# Patient Record
Sex: Female | Born: 2005
Health system: Southern US, Community
[De-identification: ages and names within clinical notes are randomized; demographics above are authoritative.]

---
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
## 2024-01-30 ENCOUNTER — Other Ambulatory Visit (HOSPITAL_COMMUNITY)
Admission: RE | Admit: 2024-01-30 | Discharge: 2024-01-30 | Disposition: A | Discharge status: Home / Self Care | Source: Ambulatory Visit | Attending: Certified Nurse Midwife | Admitting: Certified Nurse Midwife

## 2024-01-30 ENCOUNTER — Encounter: Payer: Self-pay | Admitting: Certified Nurse Midwife

## 2024-01-30 ENCOUNTER — Ambulatory Visit (INDEPENDENT_AMBULATORY_CARE_PROVIDER_SITE_OTHER): Admitting: Certified Nurse Midwife

## 2024-01-30 VITALS — BP 124/78 | HR 89 | Wt 132.0 lb

## 2024-01-30 DIAGNOSIS — Z3009 Encounter for other general counseling and advice on contraception: Secondary | ICD-10-CM | POA: Diagnosis not present

## 2024-01-30 DIAGNOSIS — Z113 Encounter for screening for infections with a predominantly sexual mode of transmission: Secondary | ICD-10-CM | POA: Insufficient documentation

## 2024-01-30 DIAGNOSIS — Z3041 Encounter for surveillance of contraceptive pills: Secondary | ICD-10-CM

## 2024-01-30 NOTE — Progress Notes (Signed)
 New Teen GYN here to Establish Care.  (Mom here with pt waiting in lobby made aware of Teen time) *Mom would like to discuss if [facility] is ok or needs to change.   LMP: 01/22/24 light flow. Contraception: Pills  STD Screening: Desires. (Self Swab)    CC: HA's on right side of face behind eye will be very painful will have them before period. Will take Excedrin gives relief most times but if HA is too severe does not help at all.

## 2024-01-31 LAB — RPR: RPR Ser Ql: NONREACTIVE

## 2024-01-31 LAB — HIV ANTIBODY (ROUTINE TESTING W REFLEX): HIV Screen 4th Generation wRfx: NONREACTIVE

## 2024-01-31 NOTE — Progress Notes (Signed)
 GYNECOLOGY OFFICE VISIT NOTE  History:   Barbara Ayala is a 18 y.o. G0P0000 here today for birth control counseling. She is currently using a oral contraceptive pill, originally rx'd for painful menstrual periods. Patient reports significant improvement. She is going off to college in the fall and has recently became sexually active. She is concerned that her birth control will no longer work or work appropriately.  Reports a normal 21-25 day cycle with a 3-5 day bleed. Reports a headache around the time of her period that is managed well with Excedrin.  Patient request STD testing today.  . She denies any abnormal vaginal discharge, bleeding, pelvic pain or other concerns.     History reviewed. No pertinent past medical history.  History reviewed. No pertinent surgical history.  The following portions of the patient's history were reviewed and updated as appropriate: allergies, current medications, past family history, past medical history, past social history, past surgical history and problem list.   Health Maintenance:  Due to age, PAP and Mammogram not recommended at this time  Review of Systems:  Pertinent items noted in HPI and remainder of comprehensive ROS otherwise negative.  Physical Exam:  BP 124/78   Pulse 89   Wt 132 lb (59.9 kg)   LMP 01/22/2024  CONSTITUTIONAL: Well-developed, well-nourished female in no acute distress.  HEENT:  Normocephalic, atraumatic. External right and left ear normal. No scleral icterus.  NECK: Normal range of motion, supple, no masses noted on observation SKIN: No rash noted. Not diaphoretic. No erythema. No pallor. MUSCULOSKELETAL: Normal range of motion. No edema noted. NEUROLOGIC: Alert and oriented to person, place, and time. Normal muscle tone coordination. No cranial nerve deficit noted. PSYCHIATRIC: Normal mood and affect. Normal behavior. Normal judgment and thought content. CARDIOVASCULAR: Normal heart rate noted RESPIRATORY:  Effort and breath sounds normal, no problems with respiration noted ABDOMEN: No masses noted. No other overt distention noted.   PELVIC: Deferred  Labs and Imaging Results for orders placed or performed in visit on 01/30/24 (from the past week)  HIV antibody (with reflex)   Collection Time: 01/30/24  3:26 PM  Result Value Ref Range   HIV Screen 4th Generation wRfx Non Reactive Non Reactive  RPR   Collection Time: 01/30/24  3:26 PM  Result Value Ref Range   RPR Ser Ql Non Reactive Non Reactive   Interpretation: Comment    No results found.    Assessment and Plan:    1. Screening examination for STD (sexually transmitted disease) - Patient doing well.  - STD testing completed today - Cervicovaginal ancillary only( [place]) - HIV antibody (with reflex) - RPR  2. Birth control counseling (Primary) - Reviewed that if taken as directed birth control will continue to work.  - Discussed things like antibiotics and alcohol that may interfere with effectiveness and patient verbalized understanding .   3. Uses oral contraception - Currently on Larin . Discussed normalcy for a headache around menstruation as long as it does not become more frequent and is easily relieved.   - CNM will continue Rx OCPs for patient.    Routine preventative health maintenance measures emphasized. Please refer to After Visit Summary for other counseling recommendations.   Return in about 1 year (around 01/29/2025) for [place].    I spent 30 minutes dedicated to the care of this patient including pre-visit review of records, face to face time with the patient discussing her conditions and treatments and post visit orders.  Barbara Ayala) Barbara Singh, MSN, CNM  [facility]  01/31/24 3:22 PM

## 2024-02-02 ENCOUNTER — Ambulatory Visit: Payer: Self-pay | Admitting: Certified Nurse Midwife

## 2024-02-21 ENCOUNTER — Other Ambulatory Visit (HOSPITAL_COMMUNITY): Payer: Self-pay

## 2024-02-22 ENCOUNTER — Other Ambulatory Visit (HOSPITAL_COMMUNITY): Payer: Self-pay

## 2024-10-10 ENCOUNTER — Ambulatory Visit: Admitting: Family Medicine
